# Patient Record
Sex: Male | Born: 2000 | Hispanic: Yes | Marital: Single | Smoking: Never smoker
Health system: Southern US, Community
[De-identification: ages and names within clinical notes are randomized; demographics above are authoritative.]

---
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
## 2022-10-10 IMAGING — US US ABDOMEN LIMITED RUQ/ASCITES
1 series · 7 of 7 positions shown · non-contrast
Comparison: None.

CLINICAL DATA: Pain

EXAM:
ULTRASOUND ABDOMEN LIMITED
TECHNIQUE: Gray scale imaging of the right lower quadrant was performed to
evaluate for suspected appendicitis. Standard imaging planes and
graded compression technique were utilized.

[Series 1: us appendix (abdomen limited) · 7 acquisitions, 7 frames shown]
[im 1/7]
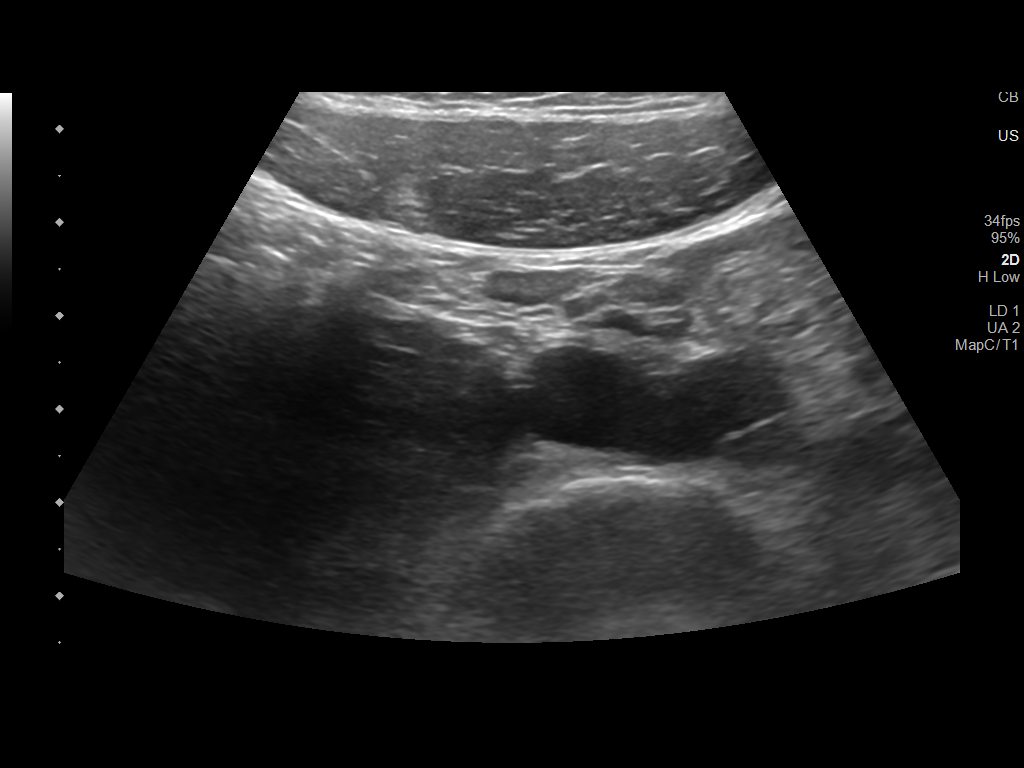
[im 2/7]
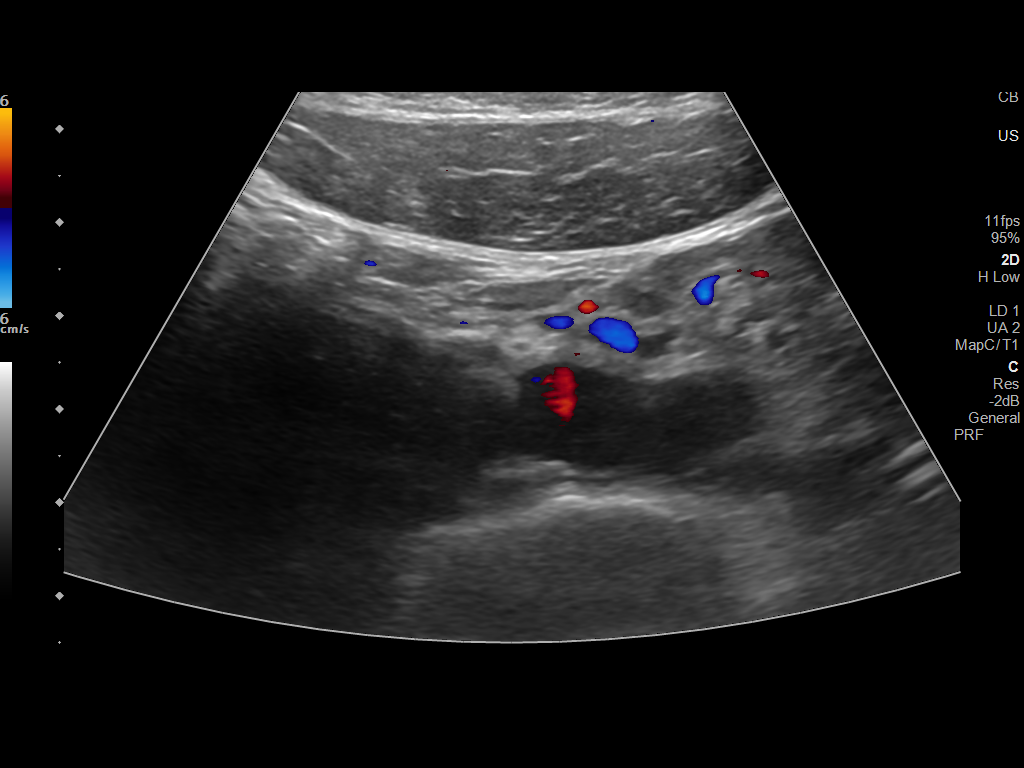
[im 3/7]
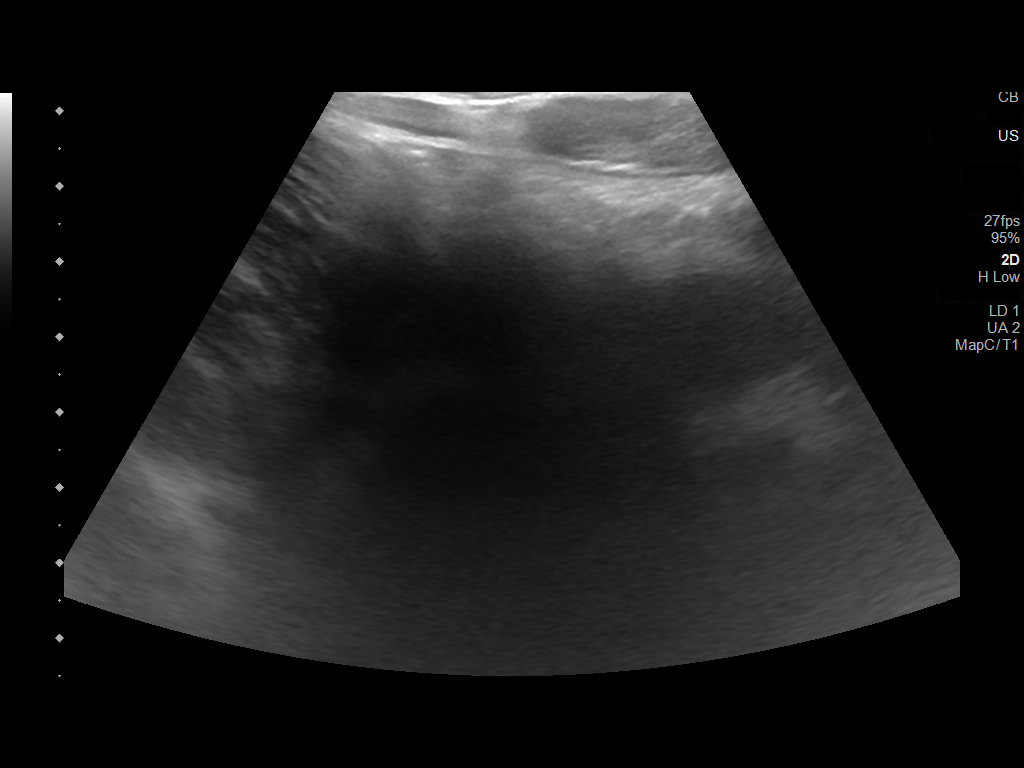
[im 4/7]
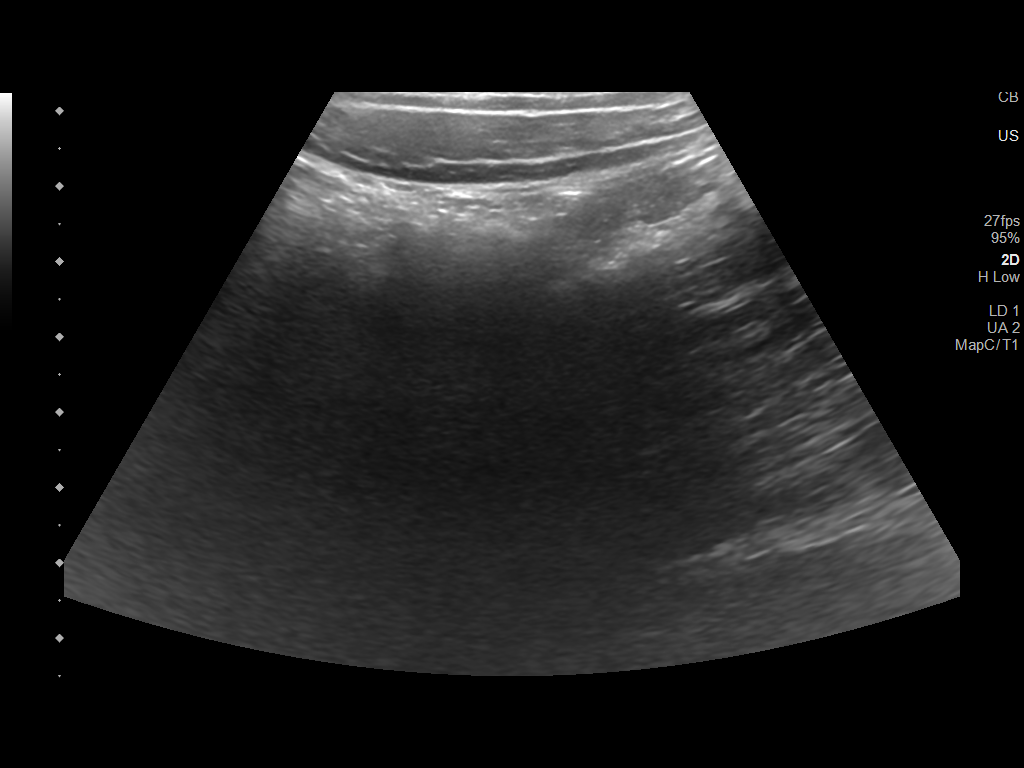
[im 5/7]
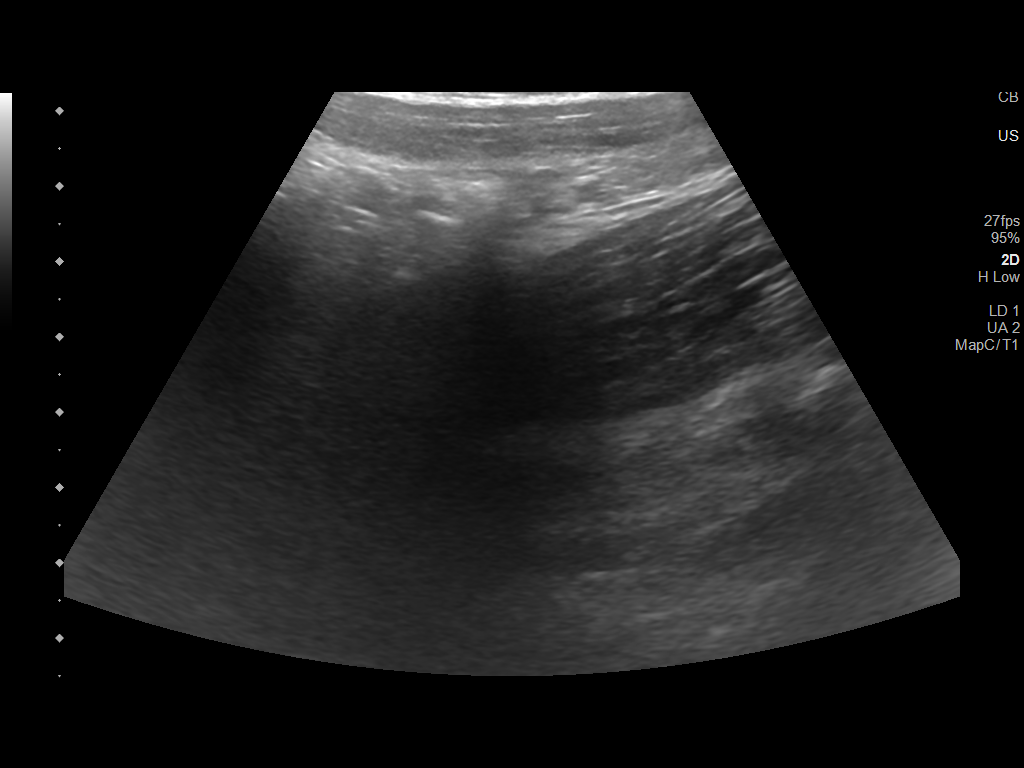
[im 6/7]
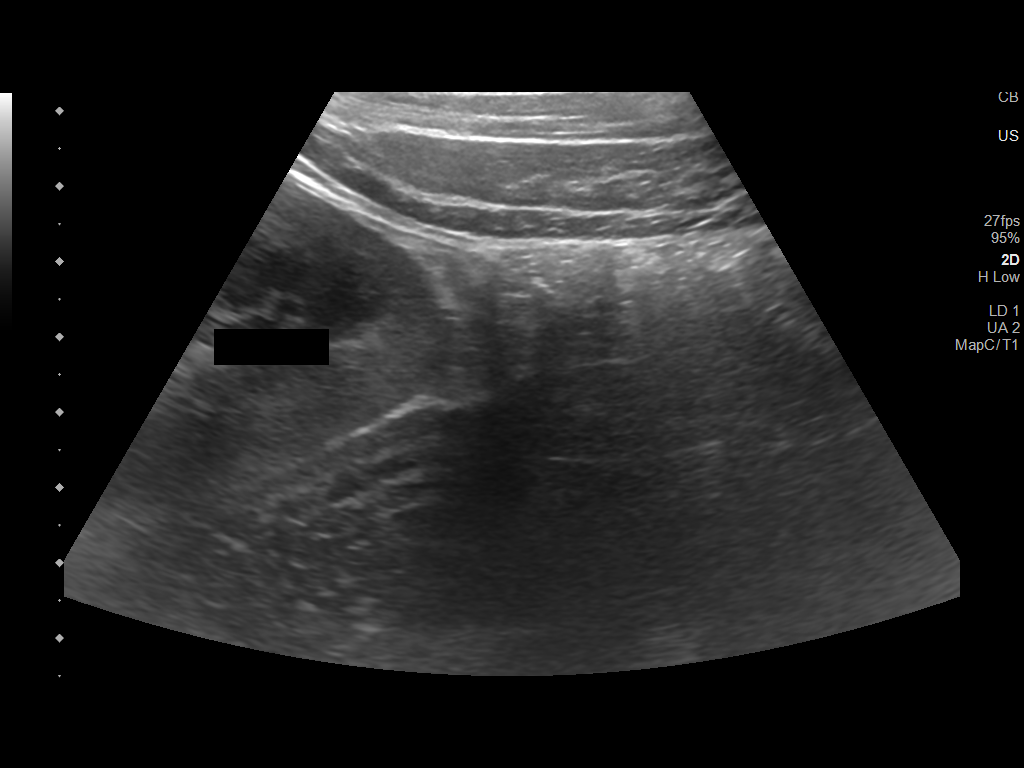
[im 7/7]
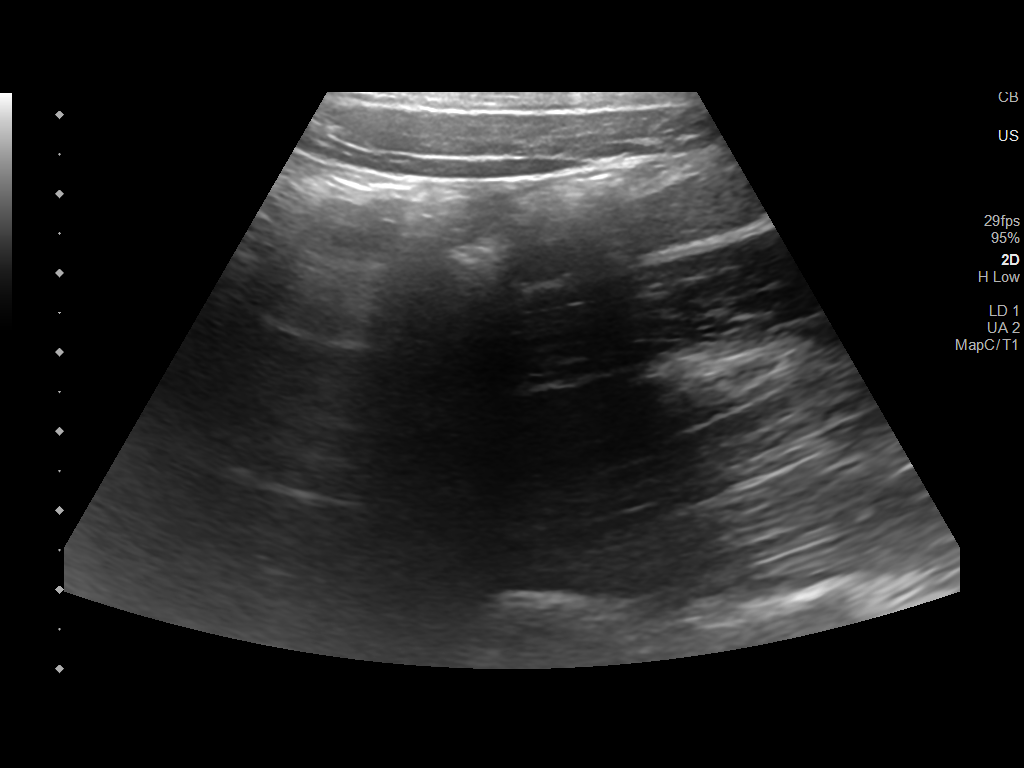

[7 of 7 positions shown; findings below may reference images not displayed]

FINDINGS: The appendix is not visualized.

Ancillary findings: No significant tenderness on transducer
pressure.

Factors affecting image quality: None.

Other findings: None.
IMPRESSION: Non visualization of the appendix. Non-visualization of appendix by
US does not definitely exclude appendicitis. If there is sufficient
clinical concern, consider abdomen pelvis CT with contrast for
further evaluation.

## 2022-10-10 IMAGING — CT CT ABD-PELV W/ CM
2 of 4 series · 16 of 46 positions shown, 18 images · IV contrast (APPLIED)
Comparison: Same day abdominal ultrasound.

CLINICAL DATA: Lower abdominal pain since yesterday

EXAM:
CT ABDOMEN AND PELVIS WITH CONTRAST
TECHNIQUE: Multidetector CT imaging of the abdomen and pelvis was performed
using the standard protocol following bolus administration of
intravenous contrast.
CONTRAST:  100mL OMNIPAQUE IOHEXOL 300 MG/ML  SOLN

[Series 3: abd/ pelvis 5.0 i30f 2 · axial · 0.80mm/px · z∈[+648,+1083]mm · 13 of 95 slices shown, 15 images]
[im 4/95  soft-tissue]
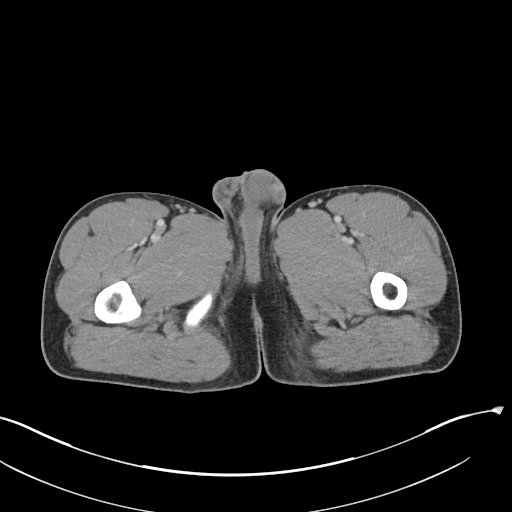
[im 4/95  bone]
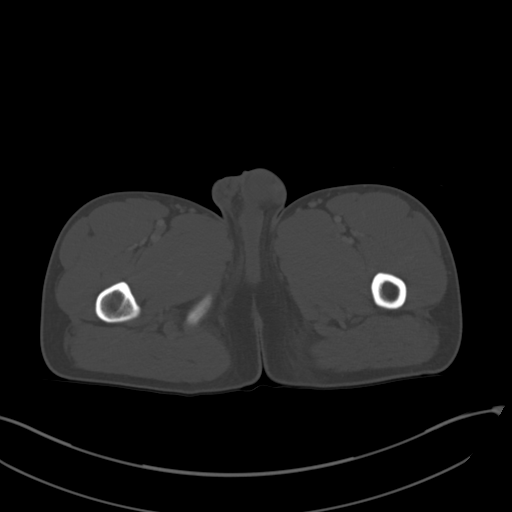
[im 12/95  soft-tissue]
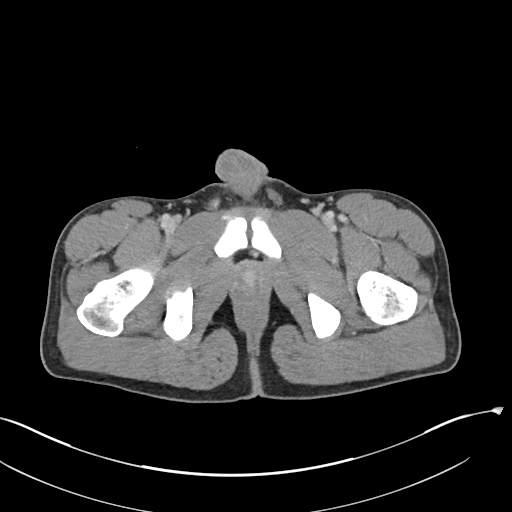
[im 20/95  soft-tissue]
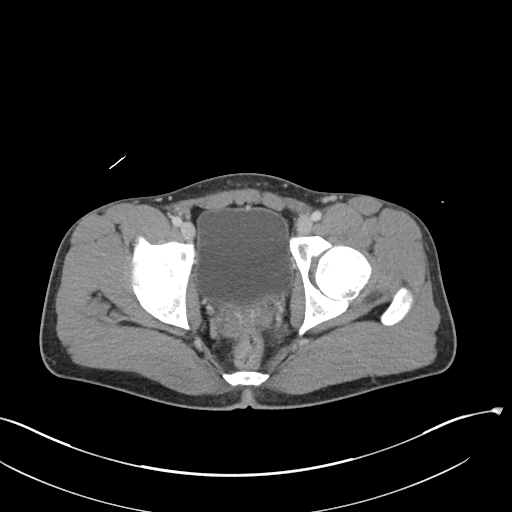
[im 28/95  soft-tissue]
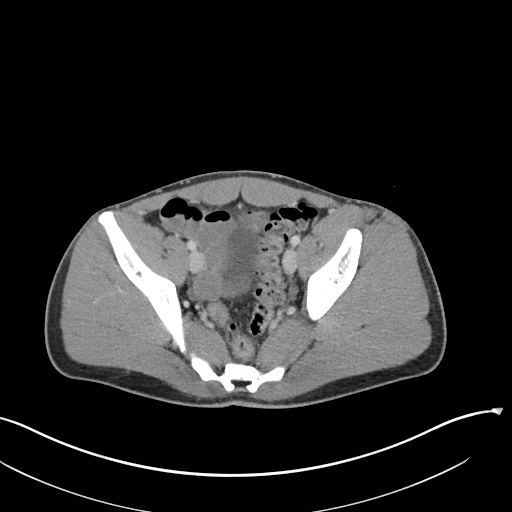
[im 32/95  soft-tissue]
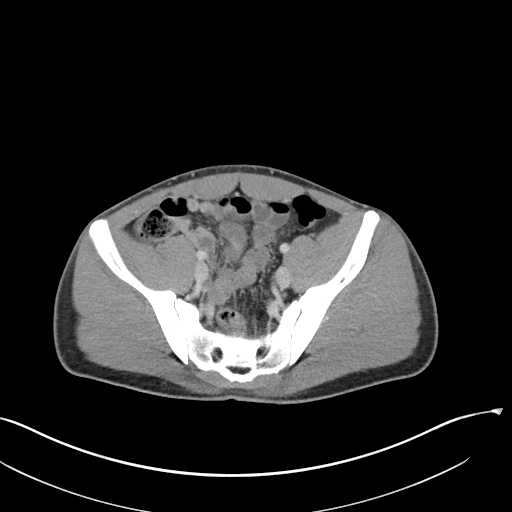
[im 40/95  soft-tissue]
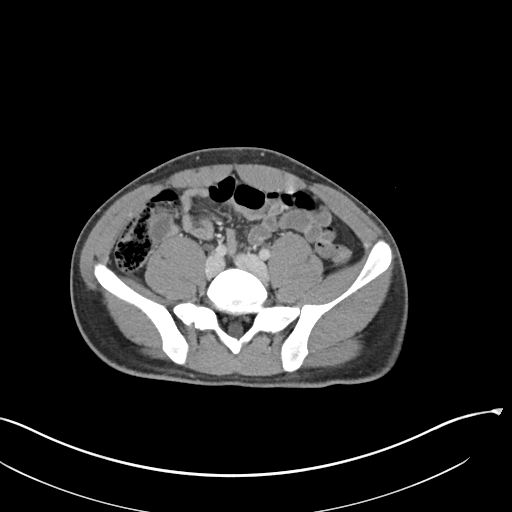
[im 48/95  soft-tissue]
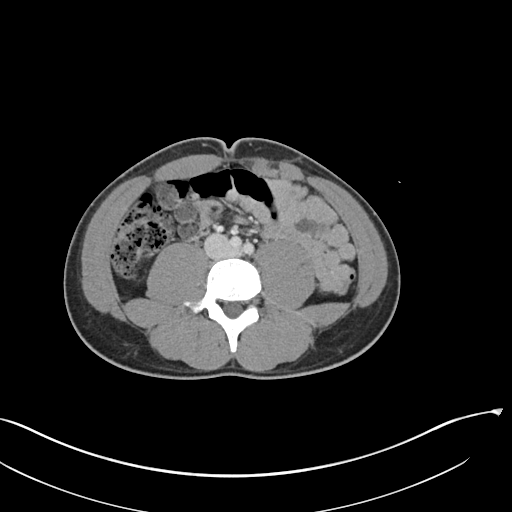
[im 55/95  soft-tissue]
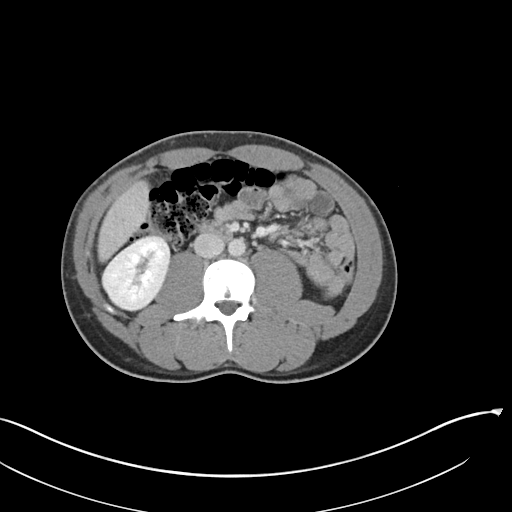
[im 63/95  soft-tissue]
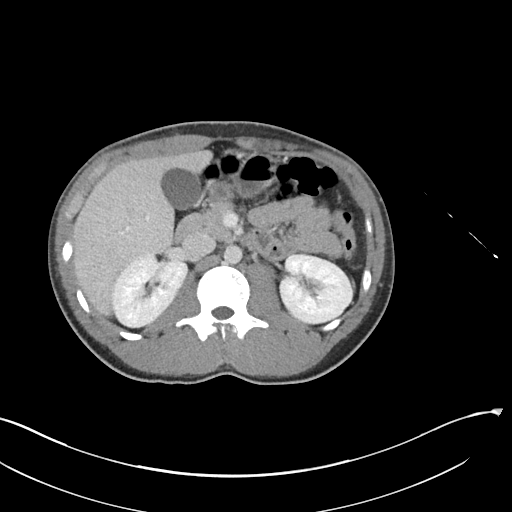
[im 63/95  bone]
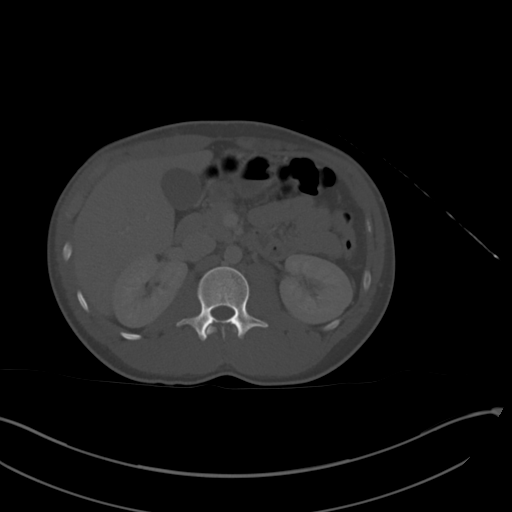
[im 67/95  soft-tissue]
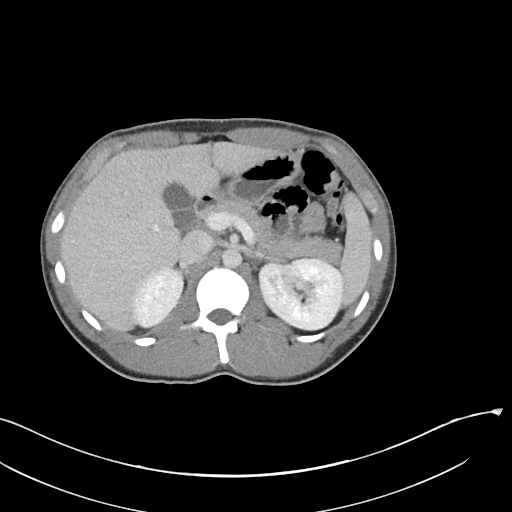
[im 75/95  soft-tissue]
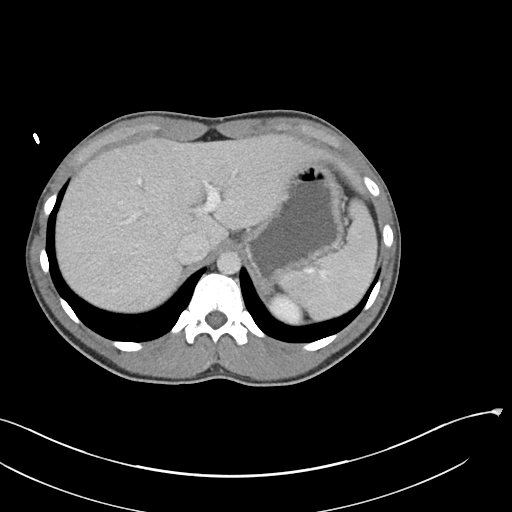
[im 83/95  soft-tissue]
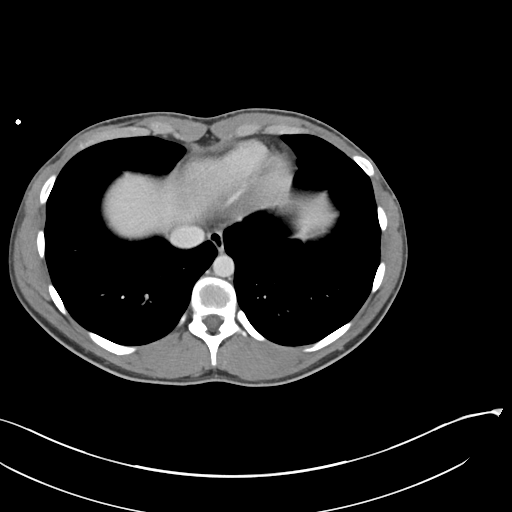
[im 91/95  soft-tissue]
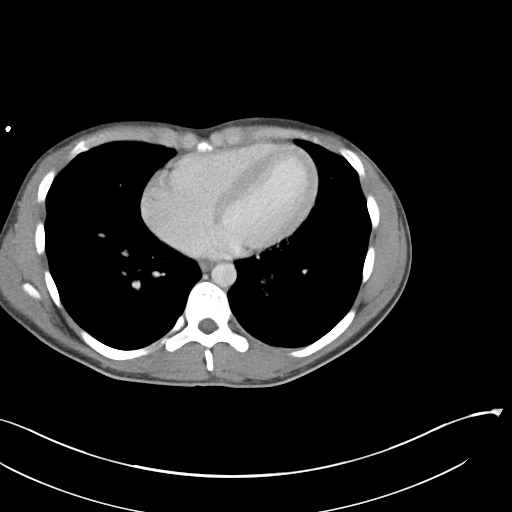

[Series 6: coronal soft tissue · coronal · 0.74mm/px · 3 of 77 slices shown]
[im 26/77  soft-tissue]
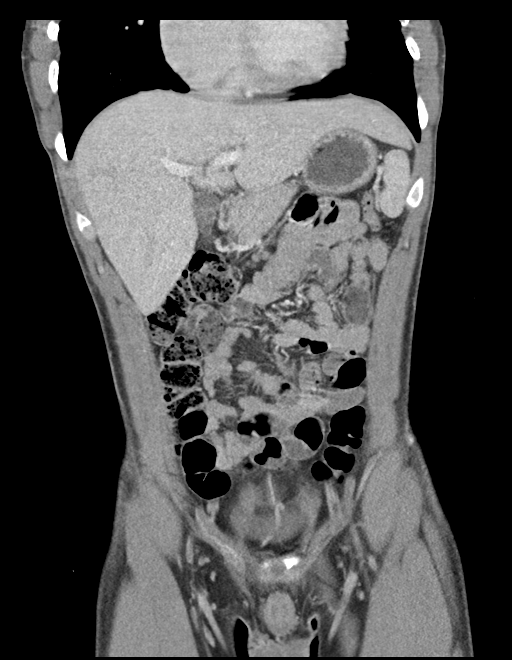
[im 34/77  soft-tissue]
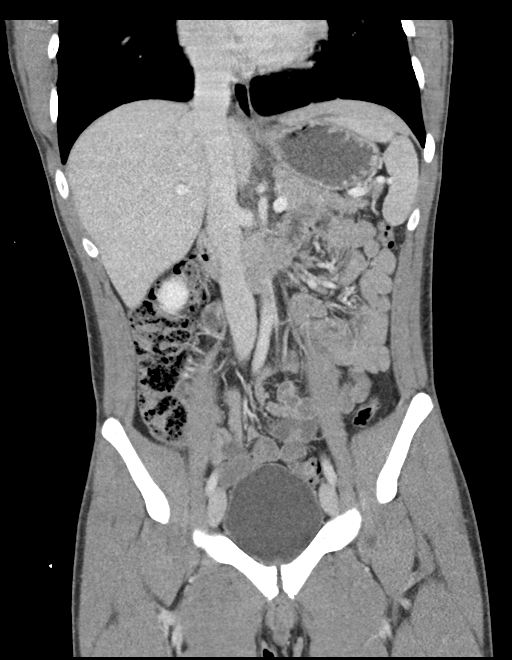
[im 43/77  soft-tissue]
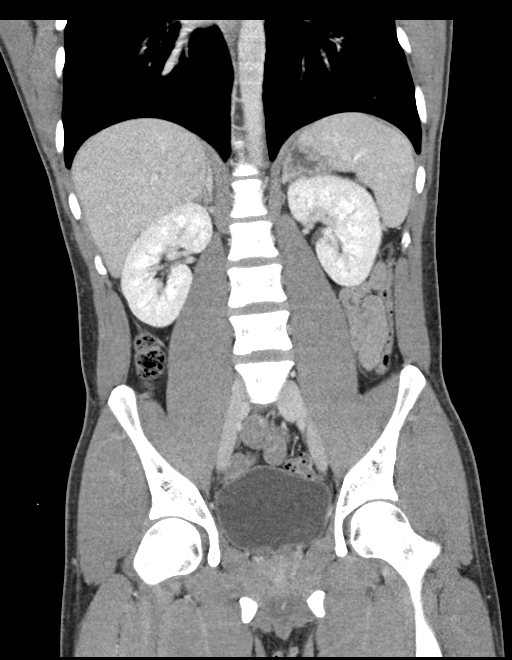

[16 of 46 positions shown; findings below may reference images not displayed]

FINDINGS: Lower chest: No acute abnormality.

Hepatobiliary: No focal liver abnormality is seen. No gallstones,
gallbladder wall thickening, or biliary dilatation.

Pancreas: Unremarkable. No pancreatic ductal dilatation or
surrounding inflammatory changes.

Spleen: Normal in size without focal abnormality.

Adrenals/Urinary Tract: Adrenal glands are unremarkable. Kidneys are
normal, without renal calculi, focal lesion, or hydronephrosis.
Bladder is unremarkable.

Stomach/Bowel: Stomach is within normal limits. Appendix is not
confidently identified however there is no pericecal or right lower
quadrant inflammation to suggest acute appendicitis. No evidence of
bowel wall thickening, distention, or inflammatory changes.

Vascular/Lymphatic: No significant vascular findings are present. No
enlarged abdominal or pelvic lymph nodes.

Reproductive: Prostate is unremarkable.

Other: No abdominal wall hernia or abnormality. No abdominopelvic
ascites.

Musculoskeletal: No acute or significant osseous findings.
IMPRESSION: No abnormality in the abdomen or pelvis.
